# Patient Record
Sex: Male | Born: 1995 | Race: White | Hispanic: No | Marital: Single | Smoking: Never smoker
Health system: Southern US, Community
[De-identification: ages and names within clinical notes are randomized; demographics above are authoritative.]

## PROBLEM LIST (undated history)

## (undated) DIAGNOSIS — F909 Attention-deficit hyperactivity disorder, unspecified type: Secondary | ICD-10-CM

---
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
## 2017-04-18 ENCOUNTER — Ambulatory Visit: Payer: BLUE CROSS/BLUE SHIELD | Admitting: Family Medicine

## 2017-04-19 ENCOUNTER — Ambulatory Visit (INDEPENDENT_AMBULATORY_CARE_PROVIDER_SITE_OTHER): Payer: BLUE CROSS/BLUE SHIELD | Admitting: Family Medicine

## 2017-04-19 ENCOUNTER — Encounter: Payer: Self-pay | Admitting: Family Medicine

## 2017-04-19 VITALS — BP 131/83 | HR 75 | Temp 98.5°F | Ht 64.5 in | Wt 184.8 lb

## 2017-04-19 DIAGNOSIS — J029 Acute pharyngitis, unspecified: Secondary | ICD-10-CM | POA: Diagnosis not present

## 2017-04-19 DIAGNOSIS — J069 Acute upper respiratory infection, unspecified: Secondary | ICD-10-CM

## 2017-04-19 LAB — POCT RAPID STREP A (OFFICE): Rapid Strep A Screen: NEGATIVE

## 2017-04-19 MED ORDER — PREDNISONE 20 MG PO TABS: ORAL_TABLET | ORAL | 0 refills | Status: AC

## 2017-04-19 NOTE — Progress Notes (Signed)
Acute Care Office visit  Assessment and plan:  No diagnosis found.  There are no diagnoses linked to this encounter.  There are no diagnoses linked to this encounter.  No problem-specific Assessment & Plan notes found for this encounter.    Education and routine counseling performed. Handouts provided.  No orders of the defined types were placed in this encounter.   Anticipatory guidance and routine counseling done re: condition, txmnt options and need for follow up. All questions of patient's were answered.  - Viral vs Allergic vs Bacterial causes for pt's symptoms reveiwed.    - Supportive care and various OTC medications discussed in addition to any prescribed. - Call or RTC if new symptoms, or if no improvement or worse over next couple days.   - Will consider ABX at that time if sx continue past 5-7 days and worsening.      Gross side effects, risk and benefits, and alternatives of medications discussed with patient.  Patient is aware that all medications have potential side effects and we are unable to predict every sideeffect or drug-drug interaction that may occur.  Expresses verbal understanding and consents to current therapy plan and treatment regiment.  No Follow-up on file.  Please see AVS handed out to patient at the end of our visit for additional patient instructions/ counseling done pertaining to today's office visit.  Note: This document was prepared using Dragon voice recognition software and may include unintentional dictation errors.    Subjective:    Chief Complaint  Patient presents with  . Sore Throat    sore throat, fevers, nasal congestion x 3 day    HPI:  Pt presents with Sx for 3-4 days   C/o: bad ST, fevers- 102 in ear, bunch mucus/ PND in throat, no cough, no RN no Head congestion, no HA, face pain  Denies:  objective F/C,   No face pain or ear pain,   No N/V/D,   No SOB/DIB, no Cough or  Pleuritic CP,  No Rash.    For  symptoms patient has tried:    Overall getting:     No problems updated.   Past medical history, Surgical history, Family history reviewed and noted below, Social history, Allergies, and Medications have been entered into the medical record, reviewed and changed as needed.   No Known Allergies  Review of Systems: General:   No F/C, wt loss Pulm:   No DIB, pleuritic chest pain Card:  No CP, palpitations Abd:  No n/v/d or pain Ext:  No inc edema from baseline   Objective:   Blood pressure 131/83, pulse 75, temperature 98.5 F (36.9 C), height 5' 4.5" (1.638 m), weight 184 lb 12.8 oz (83.8 kg). Body mass index is 31.23 kg/m. General: Well Developed, well nourished, appropriate for stated age.  Neuro: Alert and oriented x3, extra-ocular muscles intact, sensation grossly intact.  HEENT: Normocephalic, atraumatic, pupils equal round reactive to light, neck supple, no masses, no painful lymphadenopathy, TM's intact B/L, no acute findings. Nares- patent, clear d/c, OP- clear, mild erythema, No TTP sinuses Skin: Warm and dry, no gross rash. Cardiac: RRR, S1 S2,  no murmurs rubs or gallops.  Respiratory: ECTA B/L and A/P, Not using accessory muscles, speaking in full sentences- unlabored. Vascular:  No gross lower ext edema, cap RF less 2 sec. Psych: No HI/SI, judgement and insight good, Euthymic mood. Full Affect.   Patient Care Team    Relationship Specialty Notifications Start End  Tarra Pence,  Gavin Poundeborah, DO PCP - General Family Medicine  11/28/16

## 2017-04-19 NOTE — Patient Instructions (Addendum)
Most viral infections last 7-10 days.  We are being aggressive in giving you the steroids and hope to lessen the symptoms and lessen the severity of your illness.  If symptoms are worse after 10 days or he is not improved at all, please prescribe amoxicillin if his symptoms have not changed.  You most likely have a viral infection that should resolve on its own over time (or this could be a flare of seasonal allergies as well).   Symptoms for a viral upper respiratory tract infection usually last 3-7 days but can stretch out to 2-3 weeks before you're feeling back to normal.  Your symptoms should not worsen after 7-10 days and if they truely do, please notify our office, as you may need antibiotics.  You can use over-the-counter afrin nasal spray for up to 3 days (NO longer than that) which will help acutely with nasal drainage/ congestion short term.   Also, sterile saline nasal rinses, such as Lloyd HugerNeil med or AYR sinus rinses, can be very helpful and should be done twice daily- especially throughout the allergy season.   Remember you should use distilled water or previously boiled water to do this.   You can also use an over the counter cold and flu medication such as Tylenol Severe Cold and Sinus/Flu or Dayquil, Nyquil and the like, which will help with cough, congestion, headache/ pain, fevers/chills etc.  Please note, if you being treated for hypertension or have high blood pressure, you should be using the cold meds designated "HBP".    Unfortunately, antibiotics are not helpful for viral infections.  Wash your hands frequently, as you did not want to get those around you sick as well. Never sneeze or cough on others.  And you should not be going to school or work if you are running a temperature of 100.5 or more on two separate occasions.   Drink plenty of fluids and stay hydrated, especially if you are running fevers.  We don't know why, but chicken soup also helps, try it! :)   Upper  respiratory viral infection, Adult Adenoviruses are common viruses that cause many different types of infections. The viruses usually affect the lungs, but they can also affect other parts of the body, including the eyes, stomach, bowels, bladder, and brain. The most common type of adenovirus infection is the common cold. Usually, adenovirus infections are not severe unless you have another health problem that makes it hard for your body to fight off infection. What are the causes? You can get this condition if you:  Touch a surface or object that has an adenovirus on it and then touch your mouth, nose, or eyes with unwashed hands.  Come into close physical contact with an infected person, such as by hugging or shaking hands.  Breathe in droplets that fly through the air when an infected person talks, coughs, or sneezes.  Have contact with infected stool.  Swim in a pool that does not have enough chlorine.  Adenoviruses can live outside the body for many weeks. They spread easily from person to person (are contagious). What increases the risk? This condition is more likely to develop in:  People who spend a lot of time in places where there are many people, such as schools, summer camps, daycare centers, community centers, and [employer] and [employer] training centers.  Elderly adults.  People with a weak body defense system (immune system).  People with a lung disease.  People with a heart condition.  What are  the signs or symptoms? Adenovirus infections usually cause flu-like symptoms. Once the virus gets into the body, symptoms of this condition can take up to 14 days to develop. Symptoms may include:  Headache.  Stiff neck.  Sleepiness or fatigue.  Confusion or disorientation.  Fever.  Sore throat.  Cough.  Trouble breathing.  Runny nose or congestion.  Pink eye (conjunctivitis).  Bleeding into the covering of the eye.  Stomachache or diarrhea.  Nausea or  vomiting.  Blood in the urine or pain while urinating.  Ear pain or fullness.  How is this diagnosed? This condition may be diagnosed based on your symptoms and a physical exam. Your health care provider may order tests to make sure your symptoms are not caused by another type of problem. Tests can include:  Blood tests.  Urine tests.  Stool tests.  Chest X-ray.  Tissue or throat culture.  How is this treated? This condition goes away on its own with time. Treatment for this condition involves managing symptoms until the condition goes away. Your health care provider may recommend:  Rest.  Drinking more fluids.  Taking over-the-counter medicine to help relieve a sore throat, fever, or headache.  Follow these instructions at home:  Rest at home until your symptoms go away.  Drink enough fluid to keep your urine clear or pale yellow.  Take over-the-counter and prescription medicines only as told by your health care provider.  Keep all follow-up visits as told by your health care provider. This is important. How is this prevented? Adenoviruses are resistant to many cleaning products and can remain on surfaces for long periods of time. To help prevent infection:  Wash your hands often with soap and water.  Cover your nose or mouth when you sneeze or cough.  Do not touch your eyes, nose, or mouth with unwashed hands.  Clean commonly used objects often.  Do not swim in a pool that is not properly chlorinated.  Avoid close contact with people who are sick.  Do not go to school or work when you are sick.  Contact a health care provider if:  Your symptoms do not improve after 10 days.  Your symptoms get worse.  You cannot eat or drink without vomiting. Get help right away if:  You have trouble breathing or you are breathing rapidly.  Your skin, lips, or fingernails look blue (cyanosis).  You have a rapid heart rate.  You become confused.  You lose  consciousness. This information is not intended to replace advice given to you by your health care provider. Make sure you discuss any questions you have with your health care provider. Document Released: 08/19/2002 Document Revised: 01/24/2016 Document Reviewed: 01/24/2016 Elsevier Interactive Patient Education  [employer]2018 [employer]
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
